# Patient Record
Sex: Female | Born: 1960
Health system: Southern US, Community
[De-identification: ages and names within clinical notes are randomized; demographics above are authoritative.]

## PROBLEM LIST (undated history)

## (undated) DIAGNOSIS — Z789 Other specified health status: Secondary | ICD-10-CM

## (undated) HISTORY — PX: NO PAST SURGERIES: SHX2092

---

## 2005-04-07 ENCOUNTER — Emergency Department (HOSPITAL_COMMUNITY): Admission: EM | Admit: 2005-04-07 | Discharge: 2005-04-07 | Payer: Self-pay | Admitting: Emergency Medicine

## 2007-01-20 ENCOUNTER — Emergency Department (HOSPITAL_COMMUNITY): Admission: EM | Admit: 2007-01-20 | Discharge: 2007-01-20 | Payer: Self-pay | Admitting: Family Medicine

## 2011-12-26 ENCOUNTER — Emergency Department (HOSPITAL_COMMUNITY)
Admission: EM | Admit: 2011-12-26 | Discharge: 2011-12-26 | Disposition: A | Discharge status: Home / Self Care | Payer: Self-pay | Source: Home / Self Care | Attending: Family Medicine | Admitting: Family Medicine

## 2011-12-26 ENCOUNTER — Encounter (HOSPITAL_COMMUNITY): Payer: Self-pay | Admitting: Emergency Medicine

## 2011-12-26 DIAGNOSIS — R197 Diarrhea, unspecified: Secondary | ICD-10-CM

## 2011-12-26 LAB — POCT I-STAT, CHEM 8
Calcium, Ion: 1.18 mmol/L (ref 1.12–1.32)
HCT: 41 % (ref 36.0–46.0)
Sodium: 142 mEq/L (ref 135–145)
TCO2: 25 mmol/L (ref 0–100)

## 2011-12-26 NOTE — ED Provider Notes (Signed)
History     CSN: [id number]  Arrival date & time 12/26/11  1352   First MD Initiated Contact with Patient 12/26/11 1401      Chief Complaint  Patient presents with  . Diarrhea    (Consider location/radiation/quality/duration/timing/severity/associated sxs/prior treatment) Patient is a 51 y.o. female presenting with diarrhea. The history is provided by the patient.  Diarrhea The primary symptoms include diarrhea. Primary symptoms do not include fever, weight loss, nausea, vomiting, melena, hematemesis, hematochezia or dysuria. The illness began more than 7 days ago. The onset was gradual. The problem has not changed since onset. The illness is also significant for anorexia. The illness does not include chills or back pain. Associated medical issues comments: no one else ill, no travel, no blood, mucous or fever.Marland Kitchen    History reviewed. No pertinent past medical history.  History reviewed. No pertinent past surgical history.  History reviewed. No pertinent family history.  History  Substance Use Topics  . Smoking status: Not on file  . Smokeless tobacco: Not on file  . Alcohol Use: Not on file    OB History    Grav Para Term Preterm Abortions TAB SAB Ect Mult Living                  Review of Systems  Constitutional: Positive for appetite change. Negative for fever, chills, weight loss and activity change.  HENT: Negative.   Gastrointestinal: Positive for diarrhea and anorexia. Negative for nausea, vomiting, melena, hematochezia and hematemesis.  Genitourinary: Negative for dysuria.  Musculoskeletal: Negative for back pain.    Allergies  Review of patient's allergies indicates no known allergies.  Home Medications  No current outpatient prescriptions on file.  BP 151/74  Pulse 80  Temp(Src) 97.7 F (36.5 C) (Oral)  Resp 18  SpO2 100%  LMP 12/19/2011  Physical Exam  Nursing note and vitals reviewed. Constitutional: She is oriented to person, place, and time.  She appears well-developed and well-nourished. She appears distressed.  Cardiovascular: Normal rate, regular rhythm, normal heart sounds and intact distal pulses.   Pulmonary/Chest: Effort normal and breath sounds normal.  Abdominal: Soft. She exhibits no distension and no mass. Bowel sounds are increased. There is no hepatosplenomegaly. There is no tenderness. There is no rebound, no guarding and no CVA tenderness.  Neurological: She is alert and oriented to person, place, and time.  Skin: Skin is warm and dry.  Psychiatric: She has a normal mood and affect.    ED Course  Procedures (including critical care time)  Labs Reviewed  POCT I-STAT, CHEM 8 - Abnormal; Notable for the following:    Glucose, Bld 106 (*)    All other components within normal limits   No results found.   1. Diarrhea in adult patient       MDM          Linna Hoff, MD 12/26/11 [phone]

## 2011-12-26 NOTE — Discharge Instructions (Signed)
Clear liquid , bland diet tonight as tolerated, advance on mon as improved, use Align probiotic daily, return or see your doctor if any problems.

## 2011-12-26 NOTE — ED Notes (Signed)
Pt having severe heartburn and diarrhea for 2 weeks. Pt states she was taking Prilosec but stopped in case it was causing the diarrhea. Pt states she possibly took too many imodium yesterday. Pt states she has some nausea but no vomiting. Pt reports a near syncopal episode today.

## 2013-06-01 ENCOUNTER — Inpatient Hospital Stay (HOSPITAL_COMMUNITY)
Admission: AD | Admit: 2013-06-01 | Discharge: 2013-06-01 | Disposition: A | Discharge status: Home / Self Care | Payer: 59 | Source: Ambulatory Visit | Attending: Obstetrics and Gynecology | Admitting: Obstetrics and Gynecology

## 2013-06-01 ENCOUNTER — Inpatient Hospital Stay (HOSPITAL_COMMUNITY): Payer: 59

## 2013-06-01 ENCOUNTER — Encounter (HOSPITAL_COMMUNITY): Payer: Self-pay | Admitting: *Deleted

## 2013-06-01 DIAGNOSIS — N949 Unspecified condition associated with female genital organs and menstrual cycle: Secondary | ICD-10-CM | POA: Insufficient documentation

## 2013-06-01 DIAGNOSIS — N938 Other specified abnormal uterine and vaginal bleeding: Secondary | ICD-10-CM | POA: Insufficient documentation

## 2013-06-01 DIAGNOSIS — D500 Iron deficiency anemia secondary to blood loss (chronic): Secondary | ICD-10-CM | POA: Insufficient documentation

## 2013-06-01 HISTORY — DX: Other specified health status: Z78.9

## 2013-06-01 LAB — CBC
Platelets: 168 10*3/uL (ref 150–400)
RBC: 3.67 MIL/uL — ABNORMAL LOW (ref 3.87–5.11)
RDW: 13.4 % (ref 11.5–15.5)
WBC: 11.4 10*3/uL — ABNORMAL HIGH (ref 4.0–10.5)

## 2013-06-01 MED ORDER — INTEGRA F 125-1 MG PO CAPS: 1.0000 | ORAL_CAPSULE | Freq: Every day | ORAL | Status: AC

## 2013-06-01 MED ORDER — MEDROXYPROGESTERONE ACETATE 10 MG PO TABS: 10.0000 mg | ORAL_TABLET | Freq: Every day | ORAL | Status: AC

## 2013-06-01 NOTE — MAU Provider Note (Signed)
History     CSN: [id number]  Arrival date and time: 06/01/13 1038   None     Chief Complaint  Patient presents with  . Vaginal Bleeding   HPI  Alejandra Woods is a 52 y/o G1P1 who presents today c/o heavy vaginal bleeding.  She has not experienced menopause yet, but does report that recently she has missed a period for a month or two occasionally.  Her LMP was in July, and she had not had any menses since then.  She noticed some mild spotting last week, but states that she began heavily bleeding the night of 10/19.   The bleeding has been heavier than she has ever experienced before, requiring a super plus tampon with a pad to be changed every hour.  The bleeding has not improved, and has continued to present.  Today she began feeling more lightheaded, fatigued and dizzy with movement and standing upright, and she is concerned that she may be losing too much blood. She denies any pain associated with the bleeding, but does report some mild, intermittent cramping that is typical for her menses. She also denies any CP, SOB, N/V, diarrhea, constipation, bloody stools, dysuria, fever or chills. She has NKDA and is not taking any medications other than ibuprofen prn for cramping. She is not taking any OCPs.  She does not have a FHx or PMHx of GYN problems, fibroids or cancers. She does not obtain routine GYN care; her last visit was 10+ years ago.   Past Medical History  Diagnosis Date  . Medical history non-contributory     Past Surgical History  Procedure Laterality Date  . No past surgeries      History reviewed. No pertinent family history.  History  Substance Use Topics  . Smoking status: Never Smoker   . Smokeless tobacco: Not on file  . Alcohol Use: No    Allergies: No Known Allergies  Prescriptions prior to admission  Medication Sig Dispense Refill  . ibuprofen (ADVIL,MOTRIN) 200 MG tablet Take 400 mg by mouth every 6 (six) hours as needed for pain.        Review of  Systems  Constitutional: Positive for malaise/fatigue. Negative for fever, chills and weight loss.  Respiratory: Negative for shortness of breath.   Cardiovascular: Negative for chest pain.  Gastrointestinal: Negative for nausea, vomiting, diarrhea, constipation and blood in stool.  Genitourinary: Negative for dysuria.  Neurological: Positive for dizziness. Negative for headaches.   Physical Exam   Blood pressure 138/84, pulse 72, resp. rate 20, height 5\' 6"  (1.676 m), weight 97.16 kg (214 lb 3.2 oz), last menstrual period 05/29/2013, SpO2 100.00%.  Physical Exam  Constitutional: She is oriented to person, place, and time. She appears well-developed and well-nourished. No distress.  HENT:  Head: Normocephalic.  Eyes: No scleral icterus.  Neck: Neck supple.  Cardiovascular: Normal rate, regular rhythm and normal heart sounds.  Exam reveals no gallop and no friction rub.   No murmur heard. Respiratory: Effort normal and breath sounds normal. She has no wheezes.  GI: Soft. Bowel sounds are normal. She exhibits no mass. There is no tenderness.  Genitourinary: Vaginal discharge found.  Pelvic exam: normal external genitalia, vulva, vagina, cervix, uterus and adnexa, VULVA: normal appearing vulva with no masses, tenderness or lesions, VAGINA: normal appearing vagina with normal color, no lesions, heavy vaginal discharge - bloody, CERVIX: heavy cervical discharge present - bloody, cervical motion tenderness absent, multiparous os, UTERUS: enlarged, but difficult to assess due to body habitus,  ADNEXA: normal adnexa in size, nontender and no masses.   Neurological: She is alert and oriented to person, place, and time.    Labs:  Results for orders placed during the hospital encounter of 06/01/13 (from the past 24 hour(s))  CBC     Status: Abnormal   Collection Time    06/01/13 12:25 PM      Result Value Range   WBC 11.4 (*) 4.0 - 10.5 K/uL   RBC 3.67 (*) 3.87 - 5.11 MIL/uL   Hemoglobin  10.9 (*) 12.0 - 15.0 g/dL   HCT 40.9 (*) 81.1 - 91.4 %   MCV 91.3  78.0 - 100.0 fL   MCH 29.7  26.0 - 34.0 pg   MCHC 32.5  30.0 - 36.0 g/dL   RDW 78.2  95.6 - 21.3 %   Platelets 168  150 - 400 K/uL   Ultrasound:  US Transvaginal Non-ob  06/01/2013   CLINICAL DATA:  Abnormal uterine bleeding. Irregular menses. LMP 05/28/2013.  EXAM: TRANSABDOMINAL AND TRANSVAGINAL ULTRASOUND OF PELVIS  TECHNIQUE: Both transabdominal and transvaginal ultrasound examinations of the pelvis were performed. Transabdominal technique was performed for global imaging of the pelvis including uterus, ovaries, adnexal regions, and pelvic cul-de-sac. It was necessary to proceed with endovaginal exam following the transabdominal exam to visualize the endometrium and ovaries.  COMPARISON:  None  FINDINGS: Uterus  Measurements: 9.5 x 4.1 x 6.0 cm. No fibroids or other mass visualized.  Endometrium  Thickness: 16 mm. Poor definition of endometrial - myometrial junction noted, with numerous tiny cystic foci in the endometrium and adjacent uterine myometrium. These findings are suggestive of cystic endometrial hyperplasia and adenomyosis.  Right ovary  Measurements: 2.2 x 1.1 x 1.4 cm. Normal appearance/no adnexal mass.  Left ovary  Measurements: Not directly visualized by transabdominal or transvaginal sonography, however no adnexal mass identified.  Other findings  No free fluid.  IMPRESSION: Endometrial thickness measures 16 mm with tiny cystic foci noted in endometrium and adjacent myometrium. These findings are suggestive of cystic endometrial hyperplasia and adenomyosis. If bleeding remains unresponsive to hormonal or medical therapy, focal lesion work-up with sonohysterogram should be considered. Endometrial biopsy should also be considered in pre-menopausal patients at high risk for endometrial carcinoma. (Ref: Radiological Reasoning: Algorithmic Workup of Abnormal Vaginal Bleeding with Endovaginal Sonography and Sonohysterography.  AJR 2008; 086:V78-46).  No evidence of fibroids.  Normal appearance of right ovary. Nonvisualization of left ovary, however no adnexal mass identified.   Electronically Signed   By: Myles Rosenthal M.D.   On: 06/01/2013 13:59   US Pelvis Complete  06/01/2013   CLINICAL DATA:  Abnormal uterine bleeding. Irregular menses. LMP 05/28/2013.  EXAM: TRANSABDOMINAL AND TRANSVAGINAL ULTRASOUND OF PELVIS  TECHNIQUE: Both transabdominal and transvaginal ultrasound examinations of the pelvis were performed. Transabdominal technique was performed for global imaging of the pelvis including uterus, ovaries, adnexal regions, and pelvic cul-de-sac. It was necessary to proceed with endovaginal exam following the transabdominal exam to visualize the endometrium and ovaries.  COMPARISON:  None  FINDINGS: Uterus  Measurements: 9.5 x 4.1 x 6.0 cm. No fibroids or other mass visualized.  Endometrium  Thickness: 16 mm. Poor definition of endometrial - myometrial junction noted, with numerous tiny cystic foci in the endometrium and adjacent uterine myometrium. These findings are suggestive of cystic endometrial hyperplasia and adenomyosis.  Right ovary  Measurements: 2.2 x 1.1 x 1.4 cm. Normal appearance/no adnexal mass.  Left ovary  Measurements: Not directly visualized by transabdominal or transvaginal sonography, however  no adnexal mass identified.  Other findings  No free fluid.  IMPRESSION: Endometrial thickness measures 16 mm with tiny cystic foci noted in endometrium and adjacent myometrium. These findings are suggestive of cystic endometrial hyperplasia and adenomyosis. If bleeding remains unresponsive to hormonal or medical therapy, focal lesion work-up with sonohysterogram should be considered. Endometrial biopsy should also be considered in pre-menopausal patients at high risk for endometrial carcinoma. (Ref: Radiological Reasoning: Algorithmic Workup of Abnormal Vaginal Bleeding with Endovaginal Sonography and Sonohysterography.  AJR 2008; 540:J81-19).  No evidence of fibroids.  Normal appearance of right ovary. Nonvisualization of left ovary, however no adnexal mass identified.   Electronically Signed   By: Myles Rosenthal M.D.   On: 06/01/2013 13:59     MAU Course  Procedures  MDM CBC Pelvic exam w/ speculum  Pelvic US  1430 Consulted with Dr. Jolayne Panther > reviewed HPI/exam/labs/gyn hx/ultrasound results > agrees with plan of care with plan for endometrial biopsy at GYN visit.  Assessment and Plan  A:  -Menorrhagia  -Microcytic, microchromic anemia secondary to blood loss   P:  -Rx Provera 10mg  QD until seen in clinic  -Rx Integra capsule QD -Refer to GYN clinic at [facility] in 2 weeks for endometrial biopsy > message routed to staff. -Return to MAU with worsening bleeding or sx   Christiana Fuchs 06/01/2013, 11:59 AM   I examined pt and agree with documentation above and PA-S plan of care. [facility]

## 2013-06-01 NOTE — MAU Note (Signed)
Patient states she had not had a period since July. States she started bleeding on 10-19 and was very heavy and continues to be heavy. Denies pain but is getting periods of feeling dizzy and weak.

## 2013-06-03 NOTE — MAU Provider Note (Signed)
Attestation of Attending Supervision of Advanced Practitioner (CNM/NP): Evaluation and management procedures were performed by the Advanced Practitioner under my supervision and collaboration.  I have reviewed the Advanced Practitioner's note and chart, and I agree with the management and plan.  Lesslie Mossa 06/03/2013 12:17 PM   

## 2014-04-06 IMAGING — US US TRANSVAGINAL NON-OB
1 series · 13 of 25 positions shown · non-contrast
Comparison: None

CLINICAL DATA: Abnormal uterine bleeding. Irregular menses. LMP
05/28/2013.

EXAM:
TRANSABDOMINAL AND TRANSVAGINAL ULTRASOUND OF PELVIS
TECHNIQUE: Both transabdominal and transvaginal ultrasound examinations of the
pelvis were performed. Transabdominal technique was performed for
global imaging of the pelvis including uterus, ovaries, adnexal
regions, and pelvic cul-de-sac. It was necessary to proceed with
endovaginal exam following the transabdominal exam to visualize the
endometrium and ovaries.

[Series 1: us pelvis complete · 13 of 43 slices shown]
[im 1/43]
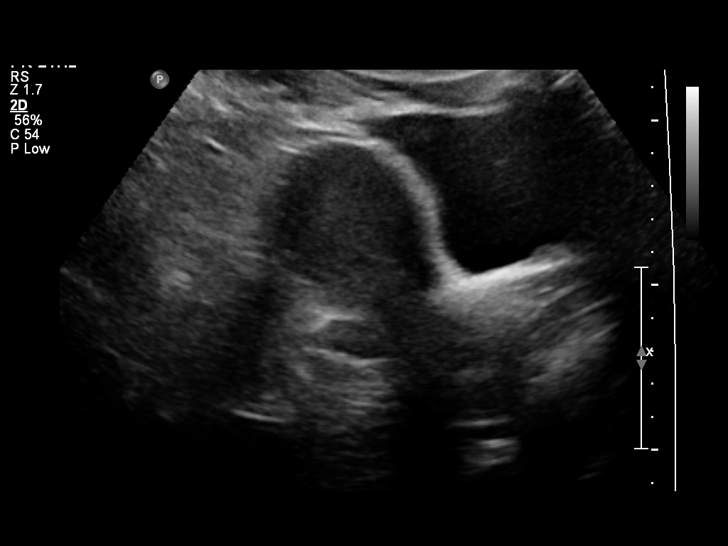
[im 4/43]
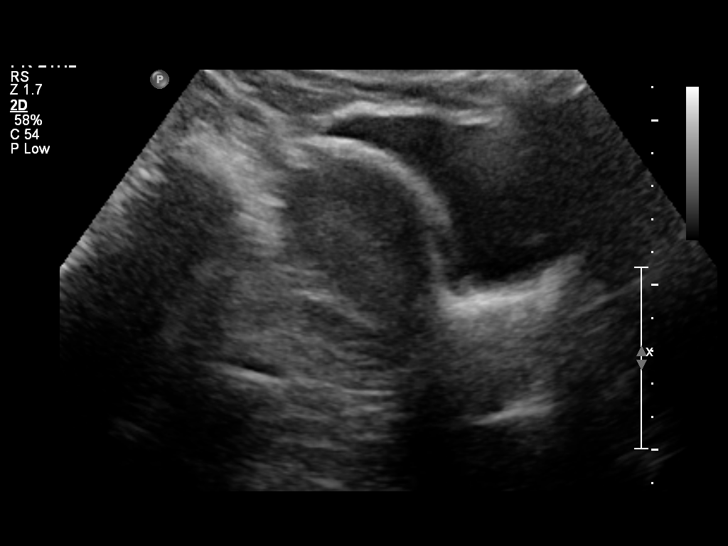
[im 8/43]
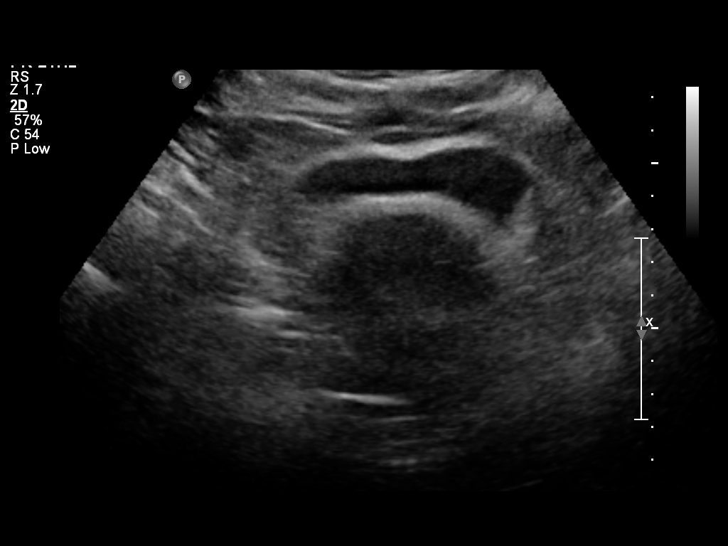
[im 11/43]
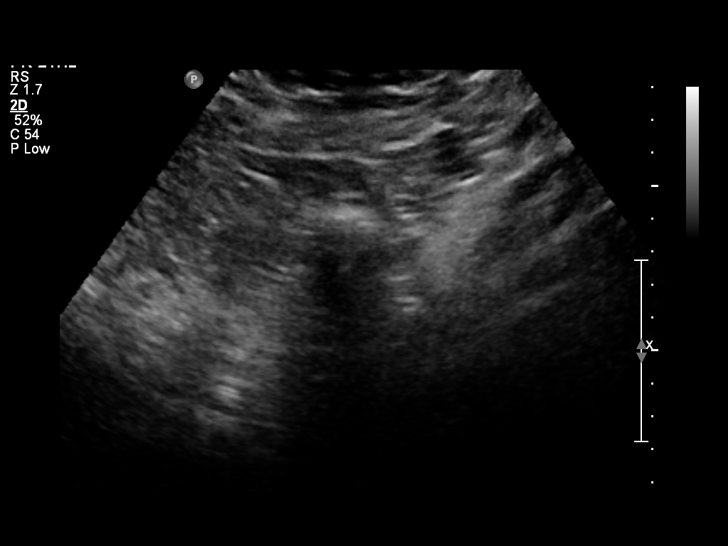
[im 15/43]
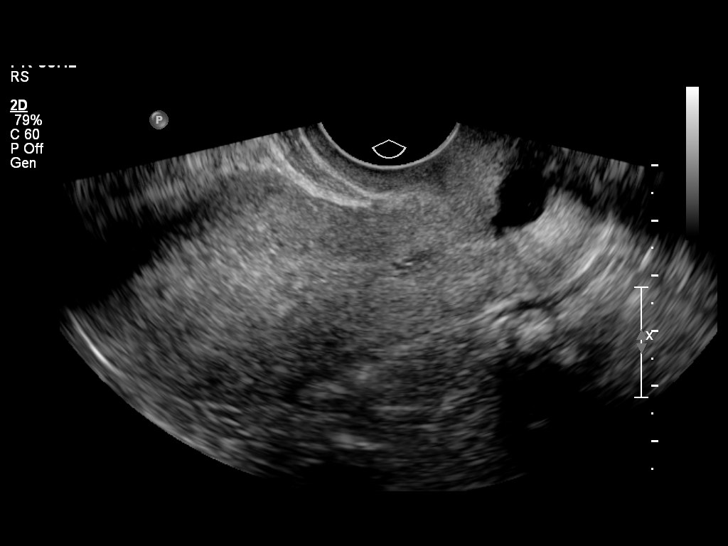
[im 18/43]
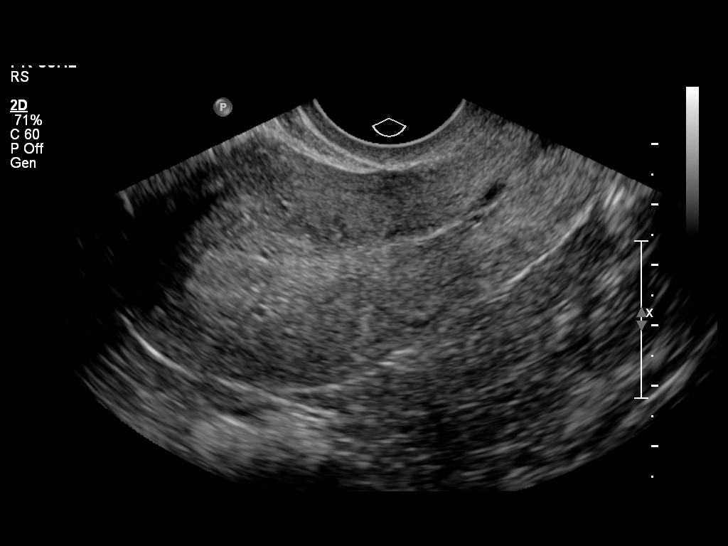
[im 22/43]
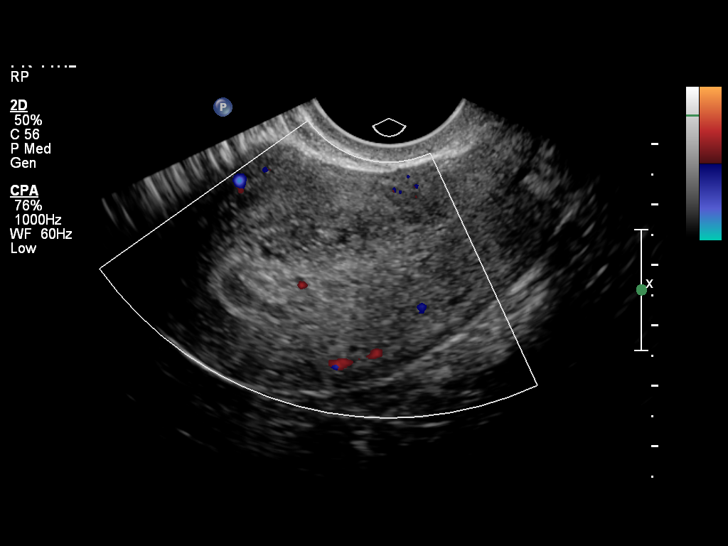
[im 25/43]
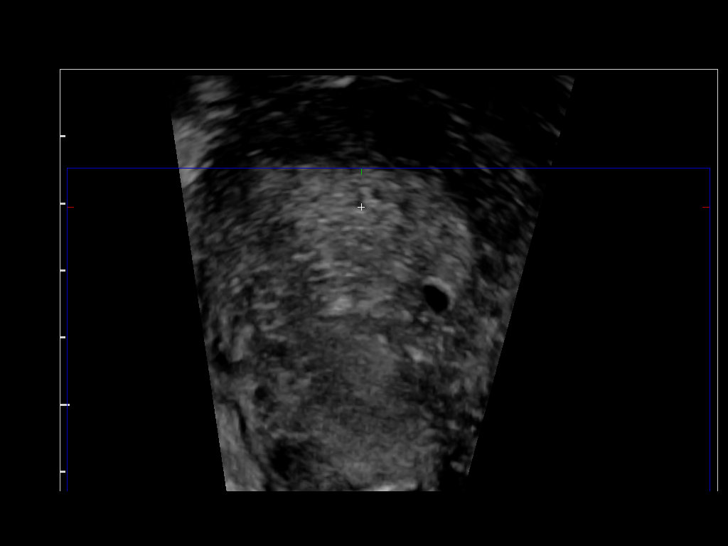
[im 29/43]
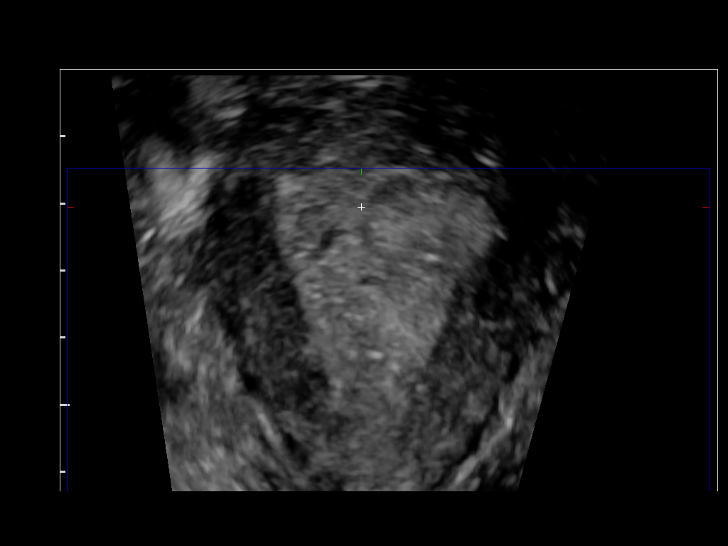
[im 32/43]
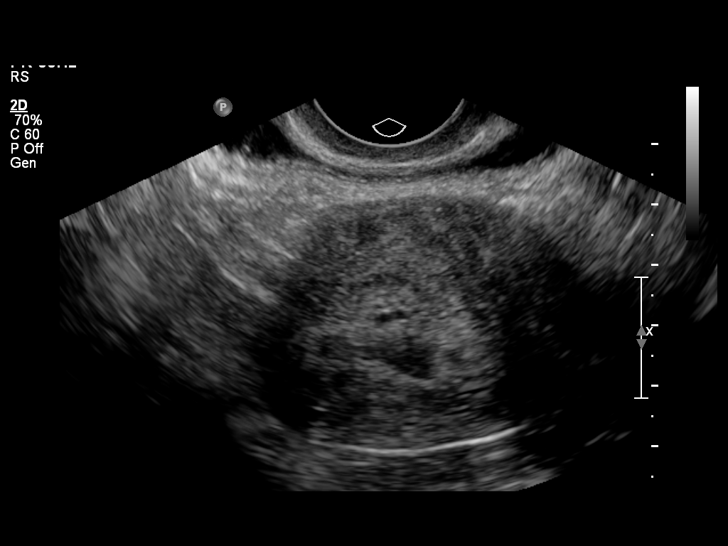
[im 36/43]
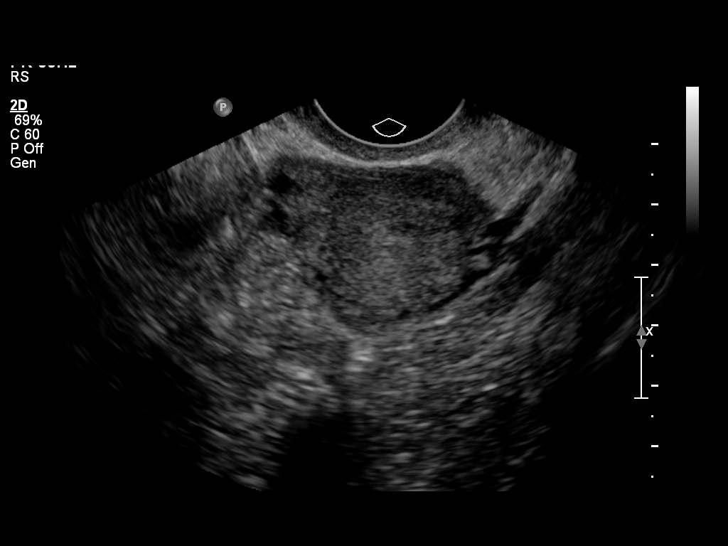
[im 39/43]
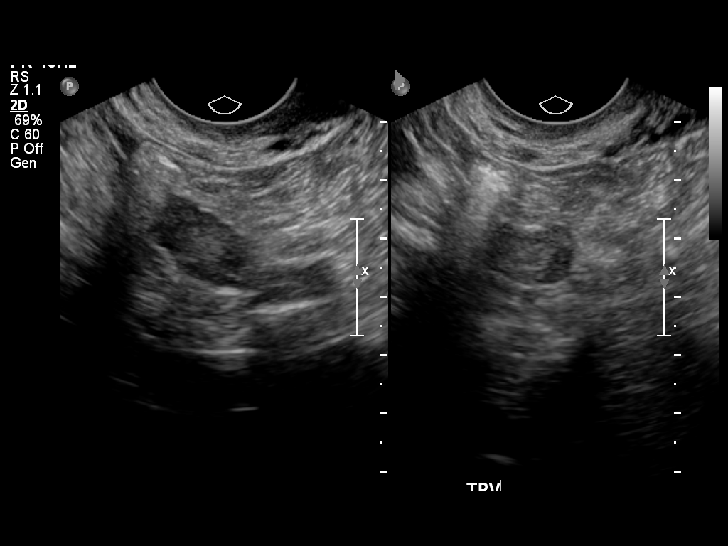
[im 43/43]
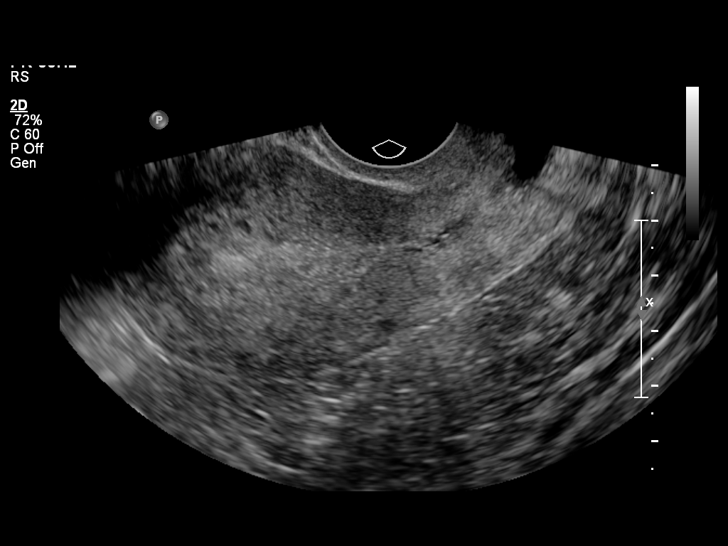

[13 of 25 positions shown; findings below may reference images not displayed]

FINDINGS: Uterus

Measurements: 9.5 x 4.1 x 6.0 cm. No fibroids or other mass
visualized.

Endometrium

Thickness: 16 mm. Poor definition of endometrial - myometrial
junction noted, with numerous tiny cystic foci in the endometrium
and adjacent uterine myometrium. These findings are suggestive of
cystic endometrial hyperplasia and adenomyosis.

Right ovary

Measurements: 2.2 x 1.1 x 1.4 cm. Normal appearance/no adnexal mass.

Left ovary

Measurements: Not directly visualized by transabdominal or
transvaginal sonography, however no adnexal mass identified.

Other findings

No free fluid.
IMPRESSION: Endometrial thickness measures 16 mm with tiny cystic foci noted in
endometrium and adjacent myometrium. These findings are suggestive
of cystic endometrial hyperplasia and adenomyosis. If bleeding
remains unresponsive to hormonal or medical therapy, focal lesion
work-up with sonohysterogram should be considered. Endometrial
biopsy should also be considered in pre-menopausal patients at high
risk for endometrial carcinoma. (Ref: Radiological Reasoning:
Algorithmic Workup of Abnormal Vaginal Bleeding with Endovaginal
Sonography and Sonohysterography. AJR 6991; 191:S68-73).

No evidence of fibroids.

Normal appearance of right ovary. Nonvisualization of left ovary,
however no adnexal mass identified.

## 2014-06-12 ENCOUNTER — Encounter (HOSPITAL_COMMUNITY): Payer: Self-pay | Admitting: *Deleted

## 2015-08-24 DIAGNOSIS — R030 Elevated blood-pressure reading, without diagnosis of hypertension: Secondary | ICD-10-CM | POA: Diagnosis not present

## 2015-08-24 DIAGNOSIS — M542 Cervicalgia: Secondary | ICD-10-CM | POA: Diagnosis not present

## 2015-08-24 MED FILL — CYCLOBENZAPRINE 5 MG TABLET: 5 | 5 days supply | Qty: 15 | Fill #0

## 2015-08-24 MED FILL — NAPROXEN 500 MG TABLET: 500 | 10 days supply | Qty: 20 | Fill #0

## 2018-12-13 ENCOUNTER — Ambulatory Visit (INDEPENDENT_AMBULATORY_CARE_PROVIDER_SITE_OTHER): Payer: Managed Care, Other (non HMO)

## 2018-12-13 ENCOUNTER — Other Ambulatory Visit: Payer: Self-pay

## 2018-12-13 ENCOUNTER — Encounter: Payer: Self-pay | Admitting: Orthopedic Surgery

## 2018-12-13 ENCOUNTER — Ambulatory Visit: Payer: Managed Care, Other (non HMO) | Admitting: Orthopedic Surgery

## 2018-12-13 DIAGNOSIS — M25562 Pain in left knee: Secondary | ICD-10-CM

## 2018-12-13 NOTE — Progress Notes (Signed)
Office Visit Note   Patient: Alejandra ParsonsJulie A Woods           Date of Birth: [birth year]           MRN: [id number] Visit Date: 12/13/2018 Requested by: No referring provider defined for this encounter. PCP: No primary care provider on file.  Subjective: Chief Complaint  Patient presents with  . Left Knee - Pain    HPI: Alejandra FanningJulie is a patient with left knee pain.'s been going on on and off for about a year.  Denies any discrete history of injury.  Reports catching with stairs.  She actually had an episode last week where something snapped in the left knee and it was very painful and then it snapped back and became less painful the next day.  She takes Advil which helps.  She does do a lot of golfing.  She is concerned about arthritis.              ROS: All systems reviewed are negative as they relate to the chief complaint within the history of present illness.  Patient denies  fevers or chills.   Assessment & Plan: Visit Diagnoses:  1. Left knee pain, unspecified chronicity     Plan: Impression is left knee pain with no effusion today and some lateral joint line tenderness.  Radiographs pretty unrevealing.  I think she may have a degenerative meniscal tear.  She wants to wait this out and see if she can get better on its own.  I think should she develop any more mechanical symptoms or significant swelling in the knee then MRI scanning would be indicated.  She will call me to get that set up.  Follow-Up Instructions: No follow-ups on file.   Orders:  Orders Placed This Encounter  Procedures  . XR Knee 1-2 Views Left   No orders of the defined types were placed in this encounter.     Procedures: No procedures performed   Clinical Data: No additional findings.  Objective: Vital Signs: There were no vitals taken for this visit.  Physical Exam:   Constitutional: Patient appears well-developed HEENT:  Head: Normocephalic Eyes:EOM are normal Neck: Normal range of motion  Cardiovascular: Normal rate Pulmonary/chest: Effort normal Neurologic: Patient is alert Skin: Skin is warm Psychiatric: Patient has normal mood and affect    Ortho Exam: Ortho exam demonstrates full active and passive range of motion of the left knee with no effusion.  She has more patellofemoral crepitus on the right than the left.  Collateral and cruciate ligaments are stable on the left-hand side.  No masses lymphadenopathy or skin changes noted in that left knee.  Pedal pulses palpable.  No groin pain with internal X rotation of the left leg  Specialty Comments:  No specialty comments available.  Imaging: Xr Knee 1-2 Views Left  Result Date: 12/13/2018 AP lateral left knee reviewed.  No fracture or dislocation is present.  Joint spaces maintained in the medial lateral and patellofemoral compartment.  No alignment issues.  Impression is mild arthritis without fracture dislocation or bone-on-bone changes in any of the 3 compartments.    PMFS History: There are no active problems to display for this patient.  Past Medical History:  Diagnosis Date  . Medical history non-contributory     History reviewed. No pertinent family history.  Past Surgical History:  Procedure Laterality Date  . NO PAST SURGERIES     Social History   Occupational History  . Not on file  Tobacco Use  . Smoking status: Never Smoker  Substance and Sexual Activity  . Alcohol use: No  . Drug use: No  . Sexual activity: Yes    Birth control/protection: None
